# Patient Record
Sex: Male | Born: 1995 | Race: Black or African American | Hispanic: No | Marital: Single | Smoking: Never smoker
Health system: Southern US, Community
[De-identification: ages and names within clinical notes are randomized; demographics above are authoritative.]

---
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
## 2013-11-05 ENCOUNTER — Encounter (HOSPITAL_COMMUNITY): Payer: Self-pay | Admitting: Emergency Medicine

## 2013-11-05 ENCOUNTER — Emergency Department (HOSPITAL_COMMUNITY): Payer: Medicaid Other

## 2013-11-05 ENCOUNTER — Emergency Department (HOSPITAL_COMMUNITY)
Admission: EM | Admit: 2013-11-05 | Discharge: 2013-11-05 | Disposition: A | Discharge status: Home / Self Care | Payer: Medicaid Other | Attending: Emergency Medicine | Admitting: Emergency Medicine

## 2013-11-05 DIAGNOSIS — J029 Acute pharyngitis, unspecified: Secondary | ICD-10-CM | POA: Insufficient documentation

## 2013-11-05 DIAGNOSIS — J111 Influenza due to unidentified influenza virus with other respiratory manifestations: Secondary | ICD-10-CM

## 2013-11-05 DIAGNOSIS — R5381 Other malaise: Secondary | ICD-10-CM | POA: Insufficient documentation

## 2013-11-05 DIAGNOSIS — J3489 Other specified disorders of nose and nasal sinuses: Secondary | ICD-10-CM | POA: Insufficient documentation

## 2013-11-05 DIAGNOSIS — R05 Cough: Secondary | ICD-10-CM | POA: Insufficient documentation

## 2013-11-05 DIAGNOSIS — B9789 Other viral agents as the cause of diseases classified elsewhere: Secondary | ICD-10-CM | POA: Insufficient documentation

## 2013-11-05 DIAGNOSIS — R059 Cough, unspecified: Secondary | ICD-10-CM | POA: Insufficient documentation

## 2013-11-05 DIAGNOSIS — IMO0001 Reserved for inherently not codable concepts without codable children: Secondary | ICD-10-CM | POA: Insufficient documentation

## 2013-11-05 DIAGNOSIS — R5383 Other fatigue: Secondary | ICD-10-CM

## 2013-11-05 MED ORDER — DM-GUAIFENESIN ER 30-600 MG PO TB12: 1.0000 | ORAL_TABLET | Freq: Two times a day (BID) | ORAL | Status: DC

## 2013-11-05 MED ORDER — OSELTAMIVIR PHOSPHATE 75 MG PO CAPS: 75.0000 mg | ORAL_CAPSULE | Freq: Two times a day (BID) | ORAL | Status: DC

## 2013-11-05 NOTE — Discharge Instructions (Signed)
Chest x-ray was negative. Your symptoms are consistent with influenza. Take Mucinex DM as needed for cough and congestion. Take the Motrin or Naprosyn as needed for the bodyaches and sore throat. Take Tamiflu as directed. Return for any newer worse symptoms.

## 2013-11-05 NOTE — ED Provider Notes (Signed)
CSN: [id number]     Arrival date & time 11/05/13  0522 History   First MD Initiated Contact with Patient 11/05/13 [phone]     Chief Complaint  Patient presents with  . Fever  . Chills   (Consider location/radiation/quality/duration/timing/severity/associated sxs/prior Treatment) Patient is a 18 y.o. male presenting with fever. The history is provided by the patient.  Fever Associated symptoms: chills, congestion, cough, myalgias and sore throat   Associated symptoms: no confusion, no dysuria, no headaches, no nausea, no rash and no vomiting   patient with onset of flulike symptoms starting yesterday chills bodyaches fever mild sore throat cough and congestion. Patient without past history of strep throat. Patient's been taking NyQuil at home.   History reviewed. No pertinent past medical history. History reviewed. No pertinent past surgical history. History reviewed. No pertinent family history. History  Substance Use Topics  . Smoking status: Never Smoker   . Smokeless tobacco: Not on file  . Alcohol Use: No    Review of Systems  Constitutional: Positive for fever, chills and fatigue.  HENT: Positive for congestion and sore throat.   Eyes: Negative for redness.  Respiratory: Positive for cough. Negative for shortness of breath.   Gastrointestinal: Negative for nausea, vomiting and abdominal pain.  Genitourinary: Negative for dysuria.  Musculoskeletal: Positive for myalgias.  Skin: Negative for rash.  Neurological: Negative for headaches.  Hematological: Does not bruise/bleed easily.  Psychiatric/Behavioral: Negative for confusion.    Allergies  Review of patient's allergies indicates no known allergies.  Home Medications   Current Outpatient Rx  Name  Route  Sig  Dispense  Refill  . DM-Doxylamine-Acetaminophen (VICKS NYQUIL COLD & FLU) 15-6.25-325 MG/15ML LIQD   Oral   Take 15 mLs by mouth at bedtime as needed (cold symptoms).         Marland Kitchen dextromethorphan-guaiFENesin  (MUCINEX DM) 30-600 MG per 12 hr tablet   Oral   Take 1 tablet by mouth 2 (two) times daily.   14 tablet   1   . oseltamivir (TAMIFLU) 75 MG capsule   Oral   Take 1 capsule (75 mg total) by mouth every 12 (twelve) hours.   10 capsule   0    BP 156/73  Pulse 78  Temp(Src) 97.8 F (36.6 C) (Oral)  Resp 18  Ht 6' (1.829 m)  Wt 165 lb (74.844 kg)  BMI 22.37 kg/m2  SpO2 98% Physical Exam  Nursing note and vitals reviewed. Constitutional: He is oriented to person, place, and time. He appears well-developed and well-nourished. No distress.  HENT:  Head: Normocephalic and atraumatic.  Mouth/Throat: Oropharynx is clear and moist. No oropharyngeal exudate.  Mild erythema  Eyes: Conjunctivae and EOM are normal. Pupils are equal, round, and reactive to light.  Neck: Normal range of motion. Neck supple.  Cardiovascular: Normal rate, regular rhythm and normal heart sounds.   No murmur heard. Pulmonary/Chest: Effort normal and breath sounds normal. No respiratory distress.  Abdominal: Soft. Bowel sounds are normal. There is no tenderness.  Musculoskeletal: Normal range of motion.  Neurological: He is alert and oriented to person, place, and time. No cranial nerve deficit. He exhibits normal muscle tone. Coordination normal.  Skin: Skin is warm. No rash noted.    ED Course  Procedures (including critical care time) Labs Review Labs Reviewed - No data to display Imaging Review Dg Chest 2 View  11/05/2013   CLINICAL DATA:  Fever, chills, cough and congestion for 2-3 days.  EXAM: CHEST  2 VIEW  COMPARISON:  None.  FINDINGS: The heart size and mediastinal contours are within normal limits. Both lungs are clear. The visualized skeletal structures are unremarkable.  IMPRESSION: No active cardiopulmonary disease.   Electronically Signed   By: Elberta Fortisaniel  Boyle M.D.   On: 11/05/2013 07:42    EKG Interpretation   None       MDM   1. Influenza     Symptoms most likely consistent with  developing influenza. Patient with chills bodyaches some mild sore throat and fever and congestion starting yesterday. Chest x-ray is negative for pneumonia. Patient nontoxic here no tachycardia. Will treat symptomatically.    Shelda JakesScott W. Sharissa Brierley, MD 11/05/13 [phone]

## 2013-11-05 NOTE — ED Notes (Signed)
Pt complains of chills, sore throat and a fever since yesterday

## 2014-03-28 IMAGING — CR DG CHEST 2V
2 series · 2 of 2 positions shown · non-contrast
Comparison: None.

CLINICAL DATA: Fever, chills, cough and congestion for 2-3 days.

EXAM:
CHEST  2 VIEW

[w chest pa]
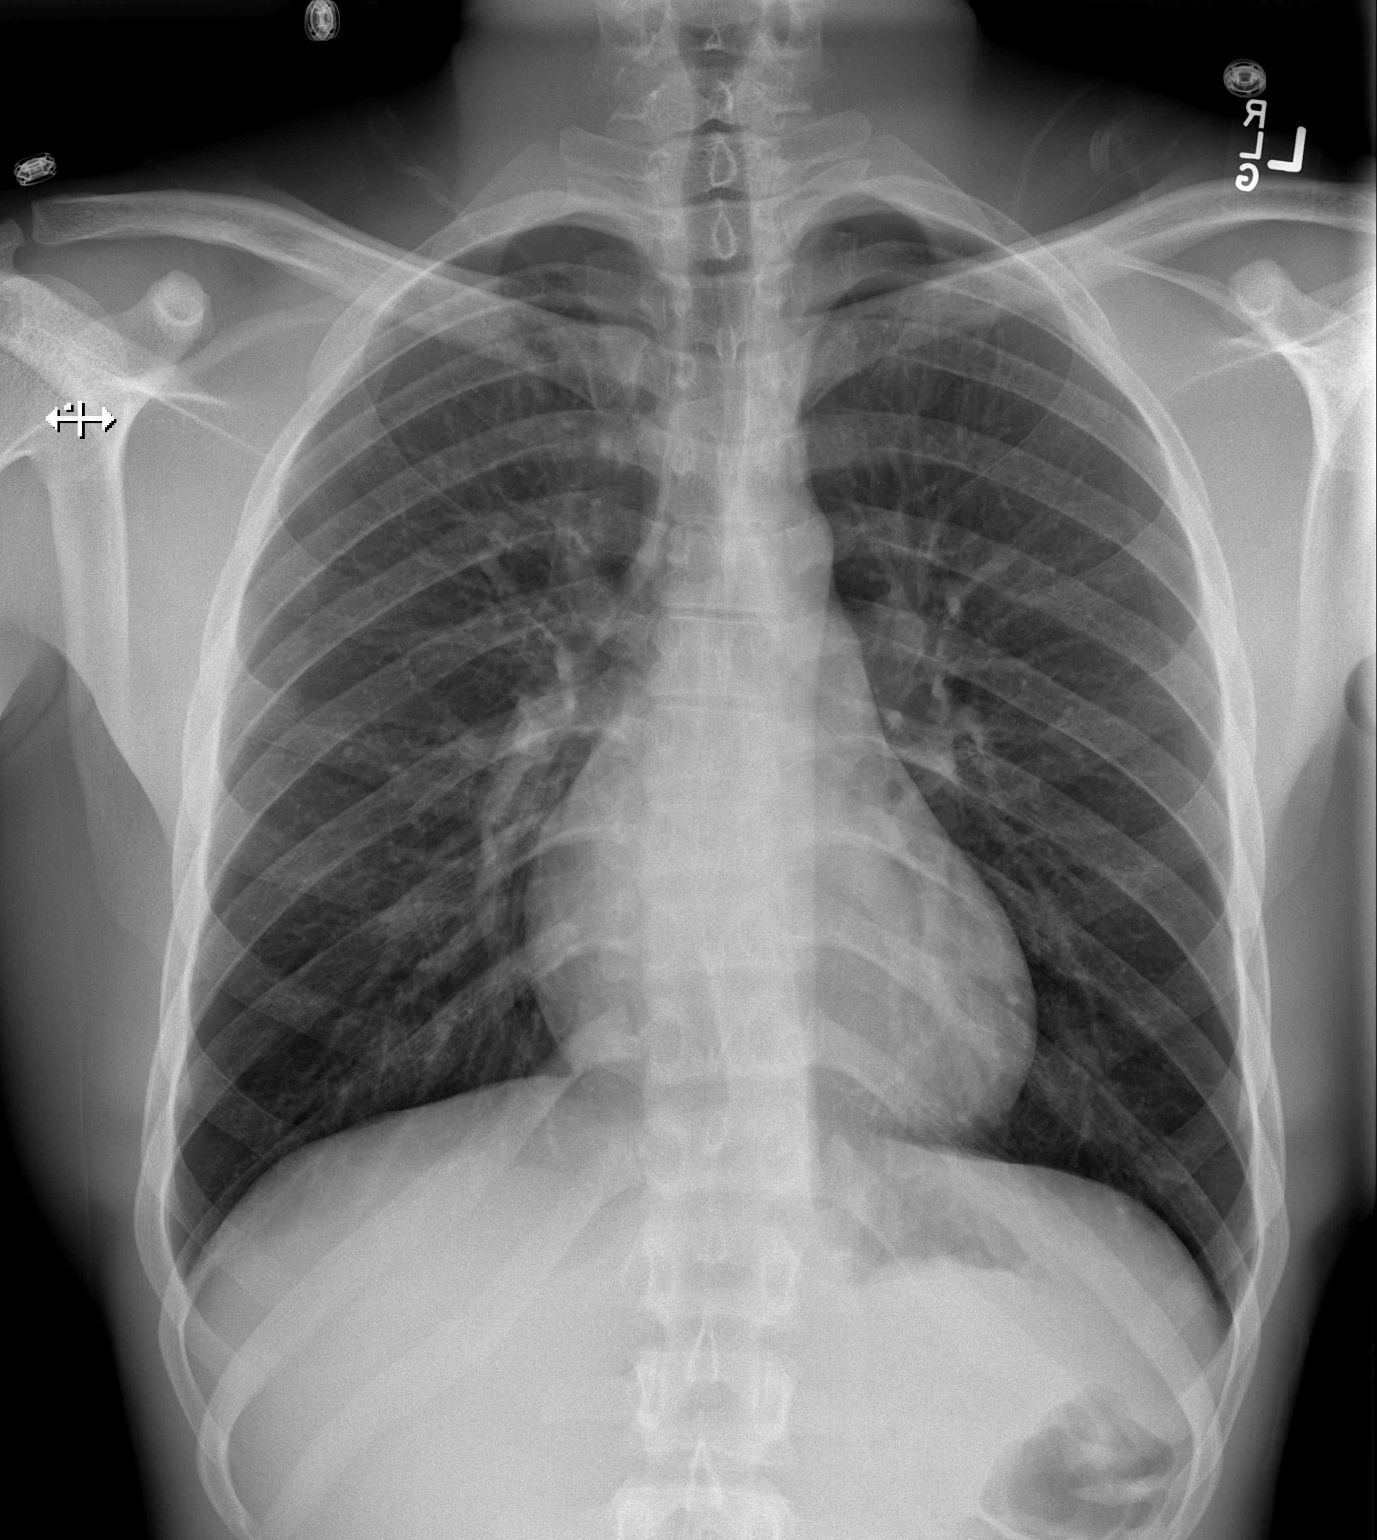

[w chest lat]
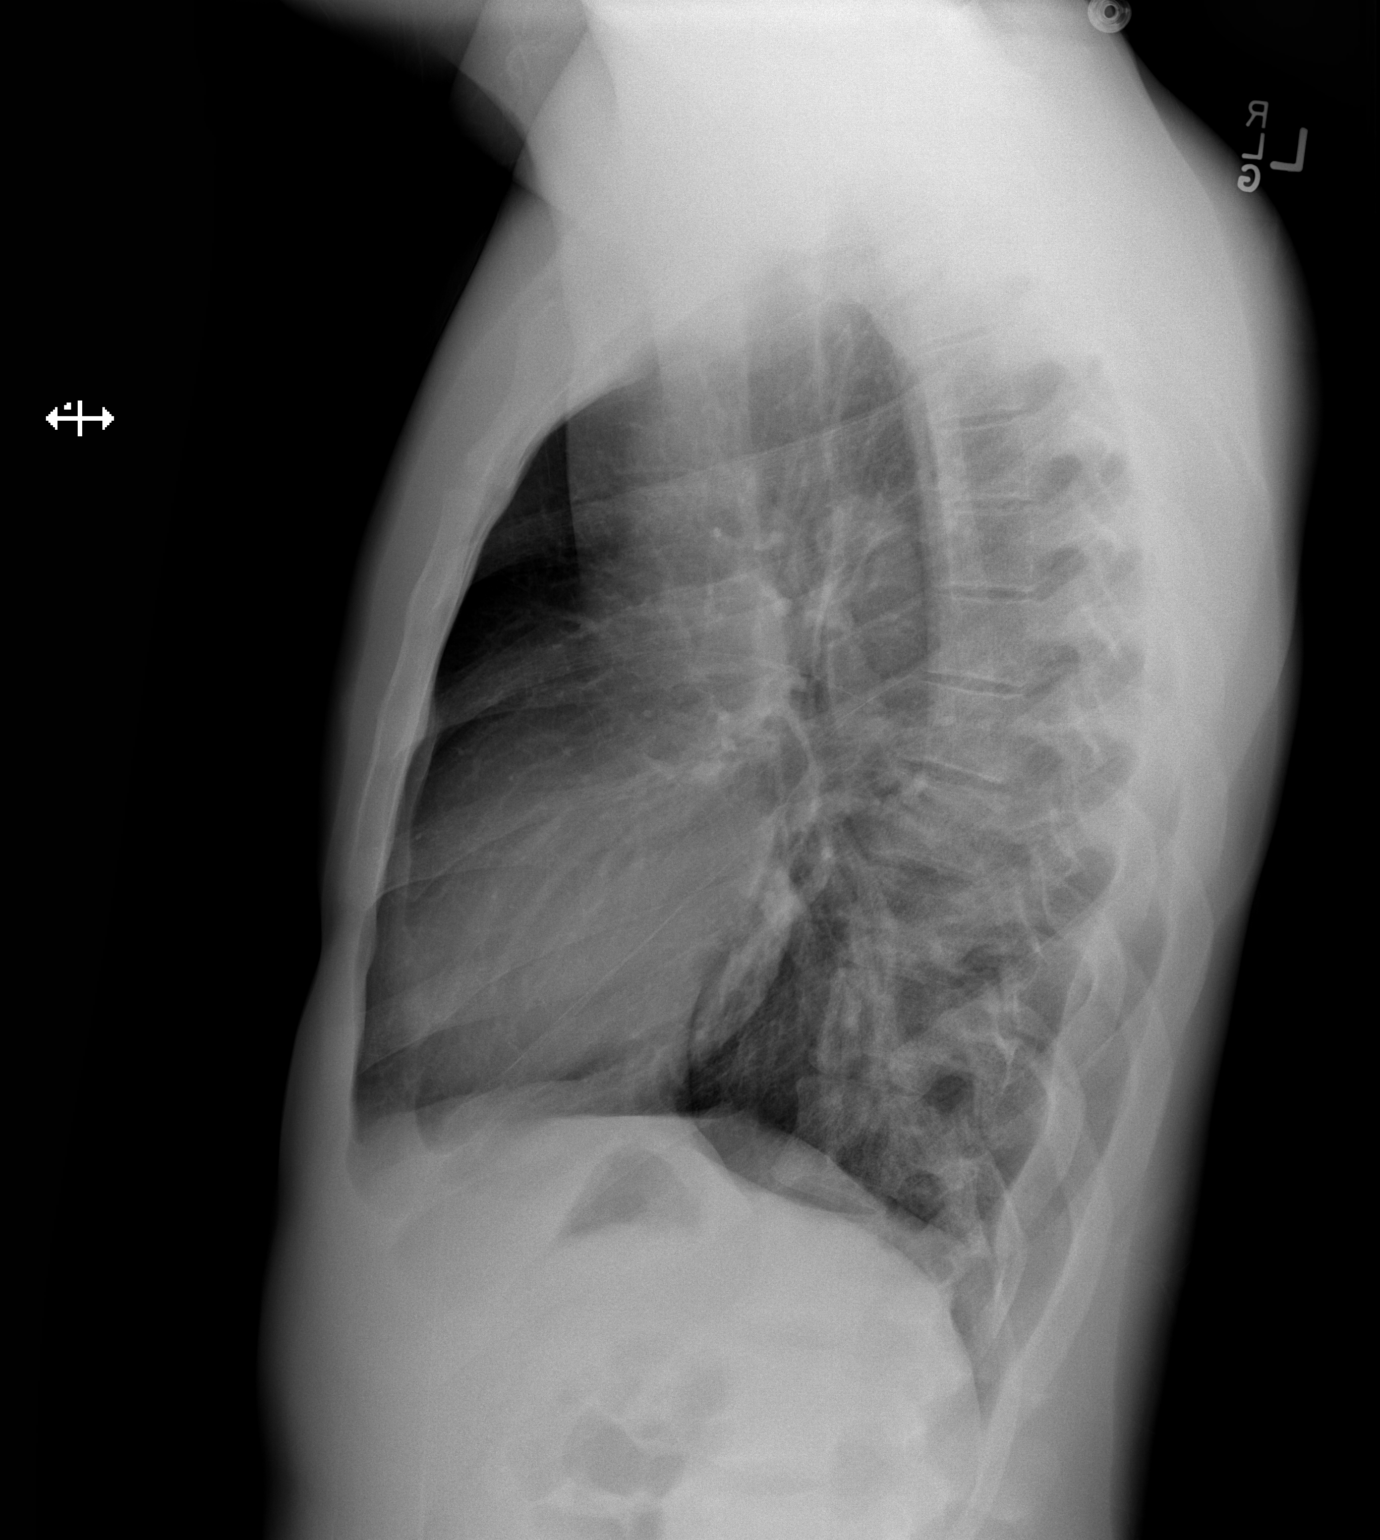

[2 of 2 positions shown; findings below may reference images not displayed]

FINDINGS: The heart size and mediastinal contours are within normal limits.
Both lungs are clear. The visualized skeletal structures are
unremarkable.
IMPRESSION: No active cardiopulmonary disease.

## 2014-11-13 ENCOUNTER — Encounter (HOSPITAL_COMMUNITY): Payer: Self-pay | Admitting: Emergency Medicine

## 2014-11-13 ENCOUNTER — Emergency Department (INDEPENDENT_AMBULATORY_CARE_PROVIDER_SITE_OTHER)
Admission: EM | Admit: 2014-11-13 | Discharge: 2014-11-13 | Disposition: A | Discharge status: Home / Self Care | Payer: Medicaid Other | Source: Home / Self Care | Attending: Emergency Medicine | Admitting: Emergency Medicine

## 2014-11-13 DIAGNOSIS — N451 Epididymitis: Secondary | ICD-10-CM

## 2014-11-13 DIAGNOSIS — N342 Other urethritis: Secondary | ICD-10-CM

## 2014-11-13 MED ORDER — DOXYCYCLINE HYCLATE 100 MG PO TABS: 100.0000 mg | ORAL_TABLET | Freq: Two times a day (BID) | ORAL | Status: DC

## 2014-11-13 MED ORDER — CEFTRIAXONE SODIUM 250 MG IJ SOLR
250.0000 mg | Freq: Once | INTRAMUSCULAR | Status: AC
  Administered 2014-11-13: 250 mg via INTRAMUSCULAR

## 2014-11-13 MED ORDER — CEFTRIAXONE SODIUM 250 MG IJ SOLR
INTRAMUSCULAR | Status: AC
  Filled 2014-11-13: qty 250

## 2014-11-13 MED ORDER — LIDOCAINE HCL (PF) 1 % IJ SOLN
INTRAMUSCULAR | Status: AC
  Filled 2014-11-13: qty 5

## 2014-11-13 MED ORDER — AZITHROMYCIN 250 MG PO TABS
1000.0000 mg | ORAL_TABLET | Freq: Once | ORAL | Status: AC
  Administered 2014-11-13: 1000 mg via ORAL

## 2014-11-13 MED ORDER — AZITHROMYCIN 250 MG PO TABS
ORAL_TABLET | ORAL | Status: AC
  Filled 2014-11-13: qty 4

## 2014-11-13 NOTE — ED Notes (Signed)
Pt states that he believes he was exposed to a STD. Pt states that he has discharge and penile and groin pain.

## 2014-11-13 NOTE — Discharge Instructions (Signed)
Epididymitis Epididymitis is a swelling (inflammation) of the epididymis. The epididymis is a cord-like structure along the back part of the testicle. Epididymitis is usually, but not always, caused by infection. This is usually a sudden problem beginning with chills, fever and pain behind the scrotum and in the testicle. There may be swelling and redness of the testicle. DIAGNOSIS  Physical examination will reveal a tender, swollen epididymis. Sometimes, cultures are obtained from the urine or from prostate secretions to help find out if there is an infection or if the cause is a different problem. Sometimes, blood work is performed to see if your white blood cell count is elevated and if a germ (bacterial) or viral infection is present. Using this knowledge, an appropriate medicine which kills germs (antibiotic) can be chosen by your caregiver. A viral infection causing epididymitis will most often go away (resolve) without treatment. HOME CARE INSTRUCTIONS   Hot sitz baths for 20 minutes, 4 times per day, may help relieve pain.  Only take over-the-counter or prescription medicines for pain, discomfort or fever as directed by your caregiver.  Take all medicines, including antibiotics, as directed. Take the antibiotics for the full prescribed length of time even if you are feeling better.  It is very important to keep all follow-up appointments. SEEK IMMEDIATE MEDICAL CARE IF:   You have a fever.  You have pain not relieved with medicines.  You have any worsening of your problems.  Your pain seems to come and go.  You develop pain, redness, and swelling in the scrotum and surrounding areas. MAKE SURE YOU:   Understand these instructions.  Will watch your condition.  Will get help right away if you are not doing well or get worse. Document Released: 10/19/2000 Document Revised: 01/14/2012 Document Reviewed: 09/08/2009 [facility] Patient Information 2015 [place], [place]. This information  is not intended to replace advice given to you by your health care provider. Make sure you discuss any questions you have with your health care provider.  Urethritis Urethritis is an inflammation of the tube through which urine exits your bladder (urethra).  CAUSES Urethritis is often caused by an infection in your urethra. The infection can be viral, like herpes. The infection can also be bacterial, like gonorrhea. RISK FACTORS Risk factors of urethritis include:  Having sex without using a condom.  Having multiple sexual partners.  Having poor hygiene. SIGNS AND SYMPTOMS Symptoms of urethritis are less noticeable in women than in men. These symptoms include:  Burning feeling when you urinate (dysuria).  Discharge from your urethra.  Blood in your urine (hematuria).  Urinating more than usual. DIAGNOSIS  To confirm a diagnosis of urethritis, your health care provider will do the following:  Ask about your sexual history.  Perform a physical exam.  Have you provide a sample of your urine for lab testing.  Use a cotton swab to gently collect a sample from your urethra for lab testing. TREATMENT  It is important to treat urethritis. Depending on the cause, untreated urethritis may lead to serious genital infections and possibly infertility. Urethritis caused by a bacterial infection is treated with antibiotic medicine. All sexual partners must be treated.  HOME CARE INSTRUCTIONS  Do not have sex until the test results are known and treatment is completed, even if your symptoms go away before you finish treatment.  If you were prescribed an antibiotic, finish it all even if you start to feel better. SEEK MEDICAL CARE IF:   Your symptoms are not improved  in 3 days.  Your symptoms are getting worse.  You develop abdominal pain or pelvic pain (in women).  You develop joint pain.  You have a fever. SEEK IMMEDIATE MEDICAL CARE IF:   You have severe pain in the belly,  back, or side.  You have repeated vomiting. MAKE SURE YOU:  Understand these instructions.  Will watch your condition.  Will get help right away if you are not doing well or get worse. Document Released: 04/17/2001 Document Revised: 03/08/2014 Document Reviewed: 06/22/2013 [facility] Patient Information 2015 [place], [place]. This information is not intended to replace advice given to you by your health care provider. Make sure you discuss any questions you have with your health care provider.

## 2014-11-13 NOTE — ED Provider Notes (Signed)
   Chief Complaint   Exposure to STD   History of Present Illness   Jack Stone is a 19 year old male who has had a three-day history of urethral discharge, burning with urination, penile pain, and testicular pain and swelling. He denies any fever, chills, nausea, or vomiting. He's had no lesions on the penis. He has had unprotected intercourse before the symptoms began. He was tested for gon[facility] and [facility]lam[facility], but he does not know the results yet, and he was not treated.  Review of Systems   Other than as noted above, the patient denies any of the following symptoms: Systemic:  No fevers chills, arthralgias, or adenopathy. GI:  No abdominal pain, nausea or vomiting. GU:  No dysuria, penile pain, discharge, itching, dysuria, genital lesions, testicular pain or swelling. Skin:  No rash or itching.  PMFSH   Past medical history, family history, social history, meds, and allergies were reviewed.   Physical Examination    Vital signs:  BP 174/79 mmHg  Pulse 62  Temp(Src) 98 F (36.7 C) (Oral)  Resp 14  SpO2 100% Gen:  Alert, oriented, in no distress. ENT:  Pharynx clear, no oral lesions.  Abdomen:  Soft and flat, non-distended, and non-tender.  No organomegaly or mass. Genital:  There is a scant, white urethral discharge. No lesions on the penis. Testicles and epididymis are tender bilaterally but no swelling or mass. No hernia. He has shotty, bilateral inguinal adenopathy. Skin:  Warm and dry.  No rash.   Labs   Serologies for HIV and syphilis were obtained.  Course in Urgent Care Center   The following medications were given:  Medications  cefTRIAXone (ROCEPHIN) injection 250 mg   azithromycin (ZITHROMAX) tablet 1,000 mg    Assessment   The primary encounter diagnosis was Urethritis. A diagnosis of Epididymitis was also pertinent to this visit.  Probable chlamydial infection.  Plan    1.  Meds:  The  following meds were prescribed:   New Prescriptions   DOXYCYCLINE (VIBRA-TABS) 100 MG TABLET    Take 1 tablet (100 mg total) by mouth 2 (two) times daily.    2.  Patient Education/Counseling:  The patient was given appropriate handouts, self care instructions, and instructed in symptomatic relief.The patient was instructed to inform all sexual contacts, avoid intercourse completely for 2 weeks and then only with a condom.  The patient was told that we would call about all abnormal lab results, and that we would need to report certain kinds of infection to the health department.    3.  Follow up:  The patient was told to follow up here if no better in 3 to 4 days, or sooner if becoming worse in any way, and given some red flag symptoms such as fever, pain, or difficulty urinating which would prompt immediate return.       Reuben Likesavid C Joakim Huesman, MD 11/13/14 [phone]

## 2014-11-15 LAB — HIV ANTIBODY (ROUTINE TESTING W REFLEX)
HIV 1/O/2 Abs-Index Value: <1 (ref <1.00)
HIV-1/HIV-2 Ab: NONREACTIVE

## 2014-11-15 LAB — RPR: RPR Ser Ql: NONREACTIVE

## 2015-01-16 ENCOUNTER — Emergency Department (HOSPITAL_COMMUNITY)
Admission: EM | Admit: 2015-01-16 | Discharge: 2015-01-16 | Disposition: A | Discharge status: Home / Self Care | Payer: Self-pay | Source: Home / Self Care | Attending: Emergency Medicine | Admitting: Emergency Medicine

## 2015-01-16 ENCOUNTER — Other Ambulatory Visit (HOSPITAL_COMMUNITY)
Admission: RE | Admit: 2015-01-16 | Discharge: 2015-01-16 | Disposition: A | Discharge status: Home / Self Care | Payer: Medicaid Other | Source: Ambulatory Visit | Attending: Emergency Medicine | Admitting: Emergency Medicine

## 2015-01-16 ENCOUNTER — Encounter (HOSPITAL_COMMUNITY): Payer: Self-pay | Admitting: Emergency Medicine

## 2015-01-16 DIAGNOSIS — A64 Unspecified sexually transmitted disease: Secondary | ICD-10-CM

## 2015-01-16 DIAGNOSIS — Z113 Encounter for screening for infections with a predominantly sexual mode of transmission: Secondary | ICD-10-CM | POA: Insufficient documentation

## 2015-01-16 LAB — POCT URINALYSIS DIP (DEVICE)
Glucose, UA: NEGATIVE mg/dL
Hgb urine dipstick: NEGATIVE
Ketones, ur: NEGATIVE mg/dL
LEUKOCYTES UA: NEGATIVE
NITRITE: NEGATIVE
Protein, ur: NEGATIVE mg/dL
Specific Gravity, Urine: >=1.03 (ref 1.005–1.030)
UROBILINOGEN UA: 0.2 mg/dL (ref 0.0–1.0)
pH: 5.5 (ref 5.0–8.0)

## 2015-01-16 MED ORDER — CEFTRIAXONE SODIUM 250 MG IJ SOLR
INTRAMUSCULAR | Status: AC
  Filled 2015-01-16: qty 250

## 2015-01-16 MED ORDER — CEFTRIAXONE SODIUM 250 MG IJ SOLR
250.0000 mg | Freq: Once | INTRAMUSCULAR | Status: AC
  Administered 2015-01-16: 250 mg via INTRAMUSCULAR

## 2015-01-16 MED ORDER — AZITHROMYCIN 250 MG PO TABS
ORAL_TABLET | ORAL | Status: AC
  Filled 2015-01-16: qty 4

## 2015-01-16 MED ORDER — AZITHROMYCIN 250 MG PO TABS
1000.0000 mg | ORAL_TABLET | Freq: Once | ORAL | Status: AC
  Administered 2015-01-16: 1000 mg via ORAL

## 2015-01-16 NOTE — Discharge Instructions (Signed)
We treated you for gonorrhea and Chlamydia today. We sent blood work and urine for additional testing. We will call you if anything comes back positive. No sex for 1 week. Follow-up if your symptoms do not resolve in the next week.

## 2015-01-16 NOTE — ED Notes (Signed)
C/o  Groin pain.  Penile discharge.    Denies fever, n/v/d.   No pelvic or abdominal pain.  Symptoms present x 3 days.

## 2015-01-16 NOTE — ED Provider Notes (Signed)
CSN: [id number]     Arrival date & time 01/16/15  1237 History   First MD Initiated Contact with Patient 01/16/15 1255     Chief Complaint  Patient presents with  . Groin Pain  . Penile Discharge   (Consider location/radiation/quality/duration/timing/severity/associated sxs/prior Treatment) HPI He is a 19 year old man here for evaluation of groin pain. He states for the last days he has had some swelling and pain in his left groin. This has spread to involve his penis and testicle. He had some penile discharge yesterday. He does report a new sexual partner in the last week where the condom fell off. No fevers, chills, abdominal pain, pelvic pain.  History reviewed. No pertinent past medical history. History reviewed. No pertinent past surgical history. History reviewed. No pertinent family history. History  Substance Use Topics  . Smoking status: Never Smoker   . Smokeless tobacco: Not on file  . Alcohol Use: No    Review of Systems  Constitutional: Negative for fever.  Gastrointestinal: Negative for abdominal pain.  Genitourinary: Positive for discharge, penile pain and testicular pain. Negative for dysuria.    Allergies  Review of patient's allergies indicates no known allergies.  Home Medications   Prior to Admission medications   Medication Sig Start Date End Date Taking? Authorizing Provider  dextromethorphan-guaiFENesin (MUCINEX DM) 30-600 MG per 12 hr tablet Take 1 tablet by mouth 2 (two) times daily. 11/05/13   Vanetta MuldersScott Zackowski, MD  DM-Doxylamine-Acetaminophen (VICKS NYQUIL COLD & FLU) 15-6.25-325 MG/15ML LIQD Take 15 mLs by mouth at bedtime as needed (cold symptoms).    Historical Provider, MD  doxycycline (VIBRA-TABS) 100 MG tablet Take 1 tablet (100 mg total) by mouth 2 (two) times daily. 11/13/14   Reuben Likesavid C Keller, MD  oseltamivir (TAMIFLU) 75 MG capsule Take 1 capsule (75 mg total) by mouth every 12 (twelve) hours. 11/05/13   Vanetta MuldersScott Zackowski, MD   BP 146/79 mmHg  Pulse  57  Temp(Src) 98 F (36.7 C) (Oral)  Resp 16  SpO2 100% Physical Exam  Constitutional: He is oriented to person, place, and time. He appears well-developed and well-nourished. No distress.  Cardiovascular: Normal rate.   Pulmonary/Chest: Effort normal.  Genitourinary: Testes normal and penis normal. Right testis shows no swelling and no tenderness. Left testis shows no swelling and no tenderness. Circumcised. No discharge found.  Lymphadenopathy:       Right: No inguinal adenopathy present.       Left: Inguinal adenopathy present.  Neurological: He is alert and oriented to person, place, and time.    ED Course  Procedures (including critical care time) Labs Review Labs Reviewed  POCT URINALYSIS DIP (DEVICE) - Abnormal; Notable for the following:    Bilirubin Urine SMALL (*)    All other components within normal limits  HIV ANTIBODY (ROUTINE TESTING)  RPR  URINE CYTOLOGY ANCILLARY ONLY    Imaging Review No results found.   MDM   1. STD (male)    Will treat presumptively with Rocephin 250 mg IM and azithromycin 1 g by mouth. Blood and urine drawn for STD screening. Follow-up as needed.    Charm RingsErin J Honig, MD 01/16/15 1344

## 2015-01-17 LAB — HIV ANTIBODY (ROUTINE TESTING W REFLEX): HIV Screen 4th Generation wRfx: NONREACTIVE

## 2015-01-17 LAB — RPR: RPR: NONREACTIVE

## 2015-01-18 LAB — URINE CYTOLOGY ANCILLARY ONLY
Chlamydia: NEGATIVE
Neisseria Gonorrhea: NEGATIVE
Trichomonas: NEGATIVE

## 2015-01-25 ENCOUNTER — Other Ambulatory Visit (HOSPITAL_COMMUNITY)
Admission: RE | Admit: 2015-01-25 | Discharge: 2015-01-25 | Disposition: A | Discharge status: Home / Self Care | Payer: Medicaid Other | Source: Ambulatory Visit | Attending: Family Medicine | Admitting: Family Medicine

## 2015-01-25 ENCOUNTER — Emergency Department (HOSPITAL_COMMUNITY)
Admission: EM | Admit: 2015-01-25 | Discharge: 2015-01-25 | Disposition: A | Discharge status: Home / Self Care | Payer: Self-pay | Source: Home / Self Care | Attending: Family Medicine | Admitting: Family Medicine

## 2015-01-25 ENCOUNTER — Encounter (HOSPITAL_COMMUNITY): Payer: Self-pay | Admitting: Emergency Medicine

## 2015-01-25 DIAGNOSIS — N342 Other urethritis: Secondary | ICD-10-CM

## 2015-01-25 DIAGNOSIS — Z113 Encounter for screening for infections with a predominantly sexual mode of transmission: Secondary | ICD-10-CM | POA: Insufficient documentation

## 2015-01-25 MED ORDER — AZITHROMYCIN 250 MG PO TABS
1000.0000 mg | ORAL_TABLET | Freq: Once | ORAL | Status: AC
  Administered 2015-01-25: 1000 mg via ORAL

## 2015-01-25 MED ORDER — CEFTRIAXONE SODIUM 250 MG IJ SOLR
250.0000 mg | Freq: Once | INTRAMUSCULAR | Status: AC
  Administered 2015-01-25: 250 mg via INTRAMUSCULAR

## 2015-01-25 MED ORDER — AZITHROMYCIN 250 MG PO TABS
ORAL_TABLET | ORAL | Status: AC
  Filled 2015-01-25: qty 4

## 2015-01-25 MED ORDER — LIDOCAINE HCL (PF) 1 % IJ SOLN
INTRAMUSCULAR | Status: AC
  Filled 2015-01-25: qty 5

## 2015-01-25 MED ORDER — CEFTRIAXONE SODIUM 250 MG IJ SOLR
INTRAMUSCULAR | Status: AC
  Filled 2015-01-25: qty 250

## 2015-01-25 NOTE — ED Provider Notes (Signed)
Jack SchilderJavon C Stone is a 19 y.o. male who presents to Urgent Care today for penile pain and discharge associated with left testicle pain. Symptoms present for the last 2-3 days. Symptoms are consistent with previous episodes of Chlamydia. Patient has not tried any treatment yet. No fevers chills nausea vomiting or diarrhea.   History reviewed. No pertinent past medical history. History reviewed. No pertinent past surgical history. History  Substance Use Topics  . Smoking status: Never Smoker   . Smokeless tobacco: Not on file  . Alcohol Use: No   ROS as above Medications: No current facility-administered medications for this encounter.   Current Outpatient Prescriptions  Medication Sig Dispense Refill  . dextromethorphan-guaiFENesin (MUCINEX DM) 30-600 MG per 12 hr tablet Take 1 tablet by mouth 2 (two) times daily. 14 tablet 1  . DM-Doxylamine-Acetaminophen (VICKS NYQUIL COLD & FLU) 15-6.25-325 MG/15ML LIQD Take 15 mLs by mouth at bedtime as needed (cold symptoms).     No Known Allergies   Exam:  BP 133/70 mmHg  Pulse 69  Temp(Src) 97.9 F (36.6 C) (Oral)  Resp 16  SpO2 98%  Gen: Well NAD Genitals: No inguinal lymphadenopathy. Testicles are descended bilaterally. The right is nontender with no masses. The left is tender in the epididymis area with no masses. Penis is normal appearing without lesions. Penis is circumcised. Small amount of discharge present.  Patient was given 1 g oral azithromycin, and 250 mg IM ceftriaxone prior to discharge.  No results found for this or any previous visit (from the past 24 hour(s)). No results found.  Assessment and Plan: 19 y.o. male with urethritis and epididymitis. Treat with ceftriaxone and azithromycin. Urine cytology for gonorrhea Chlamydia Trichomonas as well as serology for HIV and syphilis pending.  Discussed warning signs or symptoms. Please see discharge instructions. Patient expresses understanding.     Rodolph BongEvan S Bresha Hosack,  MD 01/25/15 [phone]

## 2015-01-25 NOTE — ED Notes (Signed)
Patient is aware of post injection delay

## 2015-01-25 NOTE — ED Notes (Signed)
C/o penile discharge, no urinary symptoms, onset 3 days ago

## 2015-01-25 NOTE — Discharge Instructions (Signed)
Thank you for coming in today. ° °Urethritis °Urethritis is an inflammation of the tube through which urine exits your bladder (urethra).  °CAUSES °Urethritis is often caused by an infection in your urethra. The infection can be viral, like herpes. The infection can also be bacterial, like gonorrhea. °RISK FACTORS °Risk factors of urethritis include: °· Having sex without using a condom. °· Having multiple sexual partners. °· Having poor hygiene. °SIGNS AND SYMPTOMS °Symptoms of urethritis are less noticeable in women than in men. These symptoms include: °· Burning feeling when you urinate (dysuria). °· Discharge from your urethra. °· Blood in your urine (hematuria). °· Urinating more than usual. °DIAGNOSIS  °To confirm a diagnosis of urethritis, your health care provider will do the following: °· Ask about your sexual history. °· Perform a physical exam. °· Have you provide a sample of your urine for lab testing. °· Use a cotton swab to gently collect a sample from your urethra for lab testing. °TREATMENT  °It is important to treat urethritis. Depending on the cause, untreated urethritis may lead to serious genital infections and possibly infertility. Urethritis caused by a bacterial infection is treated with antibiotic medicine. All sexual partners must be treated.  °HOME CARE INSTRUCTIONS °· Do not have sex until the test results are known and treatment is completed, even if your symptoms go away before you finish treatment. °· If you were prescribed an antibiotic, finish it all even if you start to feel better. °SEEK MEDICAL CARE IF:  °· Your symptoms are not improved in 3 days. °· Your symptoms are getting worse. °· You develop abdominal pain or pelvic pain (in women). °· You develop joint pain. °· You have a fever. °SEEK IMMEDIATE MEDICAL CARE IF:  °· You have severe pain in the belly, back, or side. °· You have repeated vomiting. °MAKE SURE YOU: °· Understand these instructions. °· Will watch your  condition. °· Will get help right away if you are not doing well or get worse. °Document Released: 04/17/2001 Document Revised: 03/08/2014 Document Reviewed: 06/22/2013 °ExitCare® Patient Information ©2015 [employer]. This information is not intended to replace advice given to you by your health care provider. Make sure you discuss any questions you have with your health care provider. ° °

## 2015-01-26 LAB — RPR: RPR Ser Ql: NONREACTIVE

## 2015-01-26 LAB — URINE CYTOLOGY ANCILLARY ONLY
Chlamydia: NEGATIVE
Neisseria Gonorrhea: NEGATIVE
Trichomonas: NEGATIVE

## 2015-01-26 LAB — HIV ANTIBODY (ROUTINE TESTING W REFLEX): HIV SCREEN 4TH GENERATION: NONREACTIVE

## 2015-03-08 ENCOUNTER — Encounter (HOSPITAL_COMMUNITY): Payer: Self-pay | Admitting: *Deleted

## 2015-03-08 ENCOUNTER — Emergency Department (INDEPENDENT_AMBULATORY_CARE_PROVIDER_SITE_OTHER)
Admission: EM | Admit: 2015-03-08 | Discharge: 2015-03-08 | Disposition: A | Discharge status: Home / Self Care | Payer: Self-pay | Source: Home / Self Care | Attending: Family Medicine | Admitting: Family Medicine

## 2015-03-08 ENCOUNTER — Other Ambulatory Visit (HOSPITAL_COMMUNITY)
Admission: RE | Admit: 2015-03-08 | Discharge: 2015-03-08 | Disposition: A | Discharge status: Home / Self Care | Payer: Self-pay | Source: Ambulatory Visit | Attending: Family Medicine | Admitting: Family Medicine

## 2015-03-08 DIAGNOSIS — Z113 Encounter for screening for infections with a predominantly sexual mode of transmission: Secondary | ICD-10-CM | POA: Insufficient documentation

## 2015-03-08 DIAGNOSIS — Z711 Person with feared health complaint in whom no diagnosis is made: Secondary | ICD-10-CM

## 2015-03-08 MED ORDER — CEFTRIAXONE SODIUM 250 MG IJ SOLR
250.0000 mg | Freq: Once | INTRAMUSCULAR | Status: AC
  Administered 2015-03-08: 250 mg via INTRAMUSCULAR

## 2015-03-08 MED ORDER — CEFTRIAXONE SODIUM 250 MG IJ SOLR
INTRAMUSCULAR | Status: AC
  Filled 2015-03-08: qty 250

## 2015-03-08 MED ORDER — AZITHROMYCIN 250 MG PO TABS
1000.0000 mg | ORAL_TABLET | Freq: Once | ORAL | Status: AC
  Administered 2015-03-08: 1000 mg via ORAL

## 2015-03-08 MED ORDER — AZITHROMYCIN 250 MG PO TABS
ORAL_TABLET | ORAL | Status: AC
  Filled 2015-03-08: qty 4

## 2015-03-08 MED ORDER — LIDOCAINE HCL (PF) 1 % IJ SOLN
INTRAMUSCULAR | Status: AC
  Filled 2015-03-08: qty 5

## 2015-03-08 NOTE — ED Notes (Signed)
Instructions  Given  Verbally  To  Pt        Pt  Was  Advised  To  Abstain  From  Sexual   Relations  For  At  Least one  Week  And  To use  Protection  And  We  Would notify  Him of any  Positive  Results     Pt  Left  Without  Signing  Discharge  Papers

## 2015-03-08 NOTE — ED Notes (Signed)
Pt  States  Pain in his  Testicles             He  Reports  A  Tingling    And    States  It  Felt  Like  It  Did  The  Last  Time  He  Had   chylmydia             He  Walked  Upright  Into  The  Exam room  With a  Steady  Fluid  Gait

## 2015-03-08 NOTE — Discharge Instructions (Signed)
We will call with positive test results and treat as indicated  °

## 2015-03-08 NOTE — ED Provider Notes (Signed)
CSN: [id number]     Arrival date & time 03/08/15  1358 History   First MD Initiated Contact with Patient 03/08/15 1519     Chief Complaint  Patient presents with  . Exposure to STD   (Consider location/radiation/quality/duration/timing/severity/associated sxs/prior Treatment) Patient is a 19 y.o. male presenting with STD exposure. The history is provided by the patient.  Exposure to STD This is a new problem. The current episode started more than 2 days ago. The problem has not changed since onset.Pertinent negatives include no abdominal pain.    History reviewed. No pertinent past medical history. History reviewed. No pertinent past surgical history. History reviewed. No pertinent family history. History  Substance Use Topics  . Smoking status: Never Smoker   . Smokeless tobacco: Not on file  . Alcohol Use: No    Review of Systems  Constitutional: Negative.   Gastrointestinal: Negative.  Negative for abdominal pain.  Genitourinary: Positive for dysuria. Negative for discharge, penile swelling, scrotal swelling, penile pain and testicular pain.    Allergies  Review of patient's allergies indicates no known allergies.  Home Medications   Prior to Admission medications   Medication Sig Start Date End Date Taking? Authorizing Provider  dextromethorphan-guaiFENesin (MUCINEX DM) 30-600 MG per 12 hr tablet Take 1 tablet by mouth 2 (two) times daily. 11/05/13   Vanetta MuldersScott Zackowski, MD  DM-Doxylamine-Acetaminophen (VICKS NYQUIL COLD & FLU) 15-6.25-325 MG/15ML LIQD Take 15 mLs by mouth at bedtime as needed (cold symptoms).    Historical Provider, MD   BP 128/84 mmHg  Pulse 60  Temp(Src) 98.6 F (37 C) (Oral)  Resp 12  SpO2 100% Physical Exam  Constitutional: He is oriented to person, place, and time. He appears well-developed and well-nourished.  Abdominal: Soft. Bowel sounds are normal. Hernia confirmed negative in the right inguinal area and confirmed negative in the left inguinal  area.  Genitourinary: Testes normal and penis normal. Cremasteric reflex is present. Right testis shows no tenderness. Left testis shows no tenderness. Circumcised. No penile tenderness. No discharge found.  Lymphadenopathy:       Right: No inguinal adenopathy present.       Left: No inguinal adenopathy present.  Neurological: He is alert and oriented to person, place, and time.  Nursing note and vitals reviewed.   ED Course  Procedures (including critical care time) Labs Review Labs Reviewed  CYTOLOGY, (ORAL, ANAL, URETHRAL) ANCILLARY ONLY    Imaging Review No results found.   MDM   1. Concern about STD in male without diagnosis        Linna HoffJames D Lonnell Chaput, MD 03/11/15 2126

## 2015-03-09 LAB — CYTOLOGY, (ORAL, ANAL, URETHRAL) ANCILLARY ONLY
Chlamydia: NEGATIVE
NEISSERIA GONORRHEA: NEGATIVE

## 2015-09-03 ENCOUNTER — Emergency Department (INDEPENDENT_AMBULATORY_CARE_PROVIDER_SITE_OTHER)
Admission: EM | Admit: 2015-09-03 | Discharge: 2015-09-03 | Disposition: A | Discharge status: Home / Self Care | Payer: Self-pay | Source: Home / Self Care | Attending: Family Medicine | Admitting: Family Medicine

## 2015-09-03 ENCOUNTER — Other Ambulatory Visit (HOSPITAL_COMMUNITY)
Admission: RE | Admit: 2015-09-03 | Discharge: 2015-09-03 | Disposition: A | Discharge status: Home / Self Care | Payer: Self-pay | Source: Ambulatory Visit | Attending: Family Medicine | Admitting: Family Medicine

## 2015-09-03 ENCOUNTER — Encounter (HOSPITAL_COMMUNITY): Payer: Self-pay | Admitting: *Deleted

## 2015-09-03 DIAGNOSIS — N342 Other urethritis: Secondary | ICD-10-CM

## 2015-09-03 DIAGNOSIS — Z113 Encounter for screening for infections with a predominantly sexual mode of transmission: Secondary | ICD-10-CM | POA: Insufficient documentation

## 2015-09-03 MED ORDER — CEFTRIAXONE SODIUM 250 MG IJ SOLR
250.0000 mg | Freq: Once | INTRAMUSCULAR | Status: AC
  Administered 2015-09-03: 250 mg via INTRAMUSCULAR

## 2015-09-03 MED ORDER — AZITHROMYCIN 250 MG PO TABS
ORAL_TABLET | ORAL | Status: AC
  Filled 2015-09-03: qty 4

## 2015-09-03 MED ORDER — CEFTRIAXONE SODIUM 250 MG IJ SOLR
INTRAMUSCULAR | Status: AC
  Filled 2015-09-03: qty 250

## 2015-09-03 MED ORDER — AZITHROMYCIN 250 MG PO TABS
1000.0000 mg | ORAL_TABLET | Freq: Once | ORAL | Status: AC
  Administered 2015-09-03: 1000 mg via ORAL

## 2015-09-03 NOTE — ED Provider Notes (Signed)
CSN: [id number]     Arrival date & time 09/03/15  1317 History   First MD Initiated Contact with Patient 09/03/15 1412     Chief Complaint  Patient presents with  . Testicle Pain   (Consider location/radiation/quality/duration/timing/severity/associated sxs/prior Treatment) Patient is a 19 y.o. male presenting with testicular pain. The history is provided by the patient.  Testicle Pain This is a recurrent problem. The current episode started yesterday. The problem has not changed since onset.Associated symptoms comments: teticle pain, urethral d/c, dysuria similar to prev episode of chlamydia.Marland Kitchen.    History reviewed. No pertinent past medical history. History reviewed. No pertinent past surgical history. History reviewed. No pertinent family history. Social History  Substance Use Topics  . Smoking status: Never Smoker   . Smokeless tobacco: None  . Alcohol Use: No    Review of Systems  Gastrointestinal: Negative.   Genitourinary: Positive for dysuria, discharge and testicular pain. Negative for penile swelling, scrotal swelling, difficulty urinating and genital sores.    Allergies  Review of patient's allergies indicates no known allergies.  Home Medications   Prior to Admission medications   Medication Sig Start Date End Date Taking? Authorizing Provider  dextromethorphan-guaiFENesin (MUCINEX DM) 30-600 MG per 12 hr tablet Take 1 tablet by mouth 2 (two) times daily. 11/05/13   Vanetta MuldersScott Zackowski, MD  DM-Doxylamine-Acetaminophen (VICKS NYQUIL COLD & FLU) 15-6.25-325 MG/15ML LIQD Take 15 mLs by mouth at bedtime as needed (cold symptoms).    Historical Provider, MD   Meds Ordered and Administered this Visit   Medications  cefTRIAXone (ROCEPHIN) injection 250 mg (not administered)  azithromycin (ZITHROMAX) tablet 1,000 mg (not administered)    BP 157/78 mmHg  Pulse 63  Temp(Src) 98 F (36.7 C) (Oral)  Resp 16  SpO2 100% No data found.   Physical Exam  Constitutional: He  is oriented to person, place, and time. He appears well-developed and well-nourished.  Abdominal: Soft.  Genitourinary: Right testis shows swelling and tenderness. Right testis shows no mass. Circumcised. Discharge found.  Lymphadenopathy:       Right: No inguinal adenopathy present.       Left: No inguinal adenopathy present.  Neurological: He is alert and oriented to person, place, and time.  Skin: Skin is warm and dry.  Nursing note and vitals reviewed.   ED Course  Procedures (including critical care time)  Labs Review Labs Reviewed  URINE CYTOLOGY ANCILLARY ONLY    Imaging Review No results found.   Visual Acuity Review  Right Eye Distance:   Left Eye Distance:   Bilateral Distance:    Right Eye Near:   Left Eye Near:    Bilateral Near:         MDM   1. Infective urethritis        Linna HoffJames D Freddye Cardamone, MD 09/03/15 [phone]

## 2015-09-03 NOTE — Discharge Instructions (Signed)
We will call with positive test results and treat as indicated  °

## 2015-09-03 NOTE — ED Notes (Signed)
Pt      Reports    Symptoms  Of  Testicular  Pain  As  Well  As  A  Penile  Discharge         Which  Started  yest     Pt  Dents  Any  Other  Symptoms      Pt  Ambulated to  Room  With a  steady  Fluid  Gait       Sitting  Upright on the  Exam table  Speaking in complete  sentances

## 2015-09-05 LAB — URINE CYTOLOGY ANCILLARY ONLY
Chlamydia: NEGATIVE
Neisseria Gonorrhea: NEGATIVE

## 2015-09-05 NOTE — ED Notes (Signed)
Final report of GC, chlamydia negative

## 2015-11-07 ENCOUNTER — Emergency Department (HOSPITAL_COMMUNITY)
Admission: EM | Admit: 2015-11-07 | Discharge: 2015-11-07 | Disposition: A | Discharge status: Home / Self Care | Payer: Worker's Compensation | Attending: Emergency Medicine | Admitting: Emergency Medicine

## 2015-11-07 ENCOUNTER — Encounter (HOSPITAL_COMMUNITY): Payer: Self-pay | Admitting: Emergency Medicine

## 2015-11-07 DIAGNOSIS — Z79899 Other long term (current) drug therapy: Secondary | ICD-10-CM | POA: Insufficient documentation

## 2015-11-07 DIAGNOSIS — Y9389 Activity, other specified: Secondary | ICD-10-CM | POA: Diagnosis not present

## 2015-11-07 DIAGNOSIS — Y990 Civilian activity done for income or pay: Secondary | ICD-10-CM | POA: Diagnosis not present

## 2015-11-07 DIAGNOSIS — Y9289 Other specified places as the place of occurrence of the external cause: Secondary | ICD-10-CM | POA: Diagnosis not present

## 2015-11-07 DIAGNOSIS — Z23 Encounter for immunization: Secondary | ICD-10-CM | POA: Insufficient documentation

## 2015-11-07 DIAGNOSIS — W260XXA Contact with knife, initial encounter: Secondary | ICD-10-CM | POA: Insufficient documentation

## 2015-11-07 DIAGNOSIS — S6992XA Unspecified injury of left wrist, hand and finger(s), initial encounter: Secondary | ICD-10-CM | POA: Diagnosis present

## 2015-11-07 DIAGNOSIS — S61211A Laceration without foreign body of left index finger without damage to nail, initial encounter: Secondary | ICD-10-CM | POA: Diagnosis not present

## 2015-11-07 DIAGNOSIS — IMO0002 Reserved for concepts with insufficient information to code with codable children: Secondary | ICD-10-CM

## 2015-11-07 MED ORDER — LIDOCAINE HCL (PF) 1 % IJ SOLN
2.0000 mL | Freq: Once | INTRAMUSCULAR | Status: AC
  Administered 2015-11-07: 2 mL
  Filled 2015-11-07: qty 5

## 2015-11-07 MED ORDER — TETANUS-DIPHTH-ACELL PERTUSSIS 5-2.5-18.5 LF-MCG/0.5 IM SUSP
0.5000 mL | Freq: Once | INTRAMUSCULAR | Status: AC
  Administered 2015-11-07: 0.5 mL via INTRAMUSCULAR
  Filled 2015-11-07: qty 0.5

## 2015-11-07 NOTE — Discharge Instructions (Signed)
A product was placed on your wound to form a temporary scab please leave the dressing in place for 48 hours than change as needed

## 2015-11-07 NOTE — ED Notes (Signed)
Patient presents for laceration to left index finger. Reports cutting it with a box cutter at work. Bleeding not controlled, no visual bone. Wound cleaned with sterile saline, wet to dry dressing applied to control bleeding. Patient denies decreased sensation to same. Rates pain 4/10.

## 2015-11-07 NOTE — ED Provider Notes (Signed)
CSN: [id number]     Arrival date & time 11/07/15  2023 History   By signing my name below, I, [name], attest that this documentation has been prepared under the direction and in the presence of Earley FavorGail Perri Lamagna, FNP.  Electronically Signed: Arlan OrganAshley Leger, ED Scribe. 11/07/2015. 9:26 PM.   Chief Complaint  Patient presents with  . Finger Injury   The history is provided by the patient. No language interpreter was used.    HPI Comments: [name] is a 20 y.o. male without any pertinent past medical history who presents to the Emergency Department here for a L index finger injury sustained approximately 1 hour prior to arrival. Pt states he was opening a box with a box cutter when he accidentally cut his finger. Bleeding controlled at this time. He now presents with an open wound to the finger. Pt c/o of constant, ongoing mild pain to the area. No cleansing attempted prior to arrival. Pt denies any recent fever, chills, nausea, vomiting. No weakness, loss of sensation, or numbness to finger. He is due for a Tetanus shot this evening.   PCP: No primary care provider on file.    History reviewed. No pertinent past medical history. History reviewed. No pertinent past surgical history. No family history on file. Social History  Substance Use Topics  . Smoking status: Never Smoker   . Smokeless tobacco: None  . Alcohol Use: No    Review of Systems  Constitutional: Negative for fever and chills.  Gastrointestinal: Negative for nausea and vomiting.  Musculoskeletal: Positive for arthralgias.  Skin: Positive for wound.  Neurological: Negative for weakness and numbness.  Psychiatric/Behavioral: Negative for confusion.      Allergies  Review of patient's allergies indicates no known allergies.  Home Medications   Prior to Admission medications   Medication Sig Start Date End Date Taking? Authorizing Provider  dextromethorphan-guaiFENesin (MUCINEX DM) 30-600 MG per 12 hr tablet Take  1 tablet by mouth 2 (two) times daily. 11/05/13   Vanetta MuldersScott Zackowski, MD  DM-Doxylamine-Acetaminophen (VICKS NYQUIL COLD & FLU) 15-6.25-325 MG/15ML LIQD Take 15 mLs by mouth at bedtime as needed (cold symptoms).    Historical Provider, MD   Triage Vitals: BP 141/96 mmHg  Pulse 77  Temp(Src) 97.9 F (36.6 C) (Oral)  Resp 20  Ht 6\' 1"  (1.854 m)  Wt 171 lb (77.565 kg)  BMI 22.57 kg/m2  SpO2 100%   Physical Exam  Constitutional: He is oriented to person, place, and time. He appears well-developed and well-nourished.  HENT:  Head: Normocephalic.  Eyes: EOM are normal.  Neck: Normal range of motion.  Cardiovascular: Normal rate, regular rhythm and normal heart sounds.   Pulmonary/Chest: Effort normal.  Abdominal: He exhibits no distension.  Musculoskeletal: Normal range of motion.  Neurological: He is alert and oriented to person, place, and time.  Skin:  1 cm x 1.5 cm avulsion to the tip of the L index finger  Psychiatric: He has a normal mood and affect.  Nursing note and vitals reviewed.   ED Course  .Marland Kitchen.Laceration Repair Date/Time: 11/07/2015 9:50 PM Performed by: Earley FavorSCHULZ, Deundra Furber Authorized by: Earley FavorSCHULZ, Namir Neto Consent: Verbal consent obtained. Written consent not obtained. Risks and benefits: risks, benefits and alternatives were discussed Consent given by: patient Patient understanding: patient states understanding of the procedure being performed Patient identity confirmed: verbally with patient Body area: upper extremity Location details: right index finger Laceration length: 1 cm Foreign bodies: no foreign bodies Tendon involvement: none Nerve involvement: none  Vascular damage: no Anesthesia: digital block Local anesthetic: lidocaine 1% without epinephrine Anesthetic total: 1 ml Preparation: Patient was prepped and draped in the usual sterile fashion. Irrigation solution: saline Amount of cleaning: standard Patient tolerance: Patient tolerated the procedure well with no  immediate complications Comments: Wound seal applied with pressure dressing   (including critical care time)  DIAGNOSTIC STUDIES: Oxygen Saturation is 100% on RA, Normal by my interpretation.    COORDINATION OF CARE: 9:18 PM- Will apply wound seal and pressure dressing to finger. Discussed treatment plan with pt at bedside and pt agreed to plan.     Labs Review Labs Reviewed - No data to display  Imaging Review No results found. I have personally reviewed and evaluated these images and lab results as part of my medical decision-making.   EKG Interpretation None     Patient instructed to keep pressure dressing on for 48 hours  MDM   Final diagnoses:  None    I personally performed the services described in this documentation, which was scribed in my presence. The recorded information has been reviewed and is accurate.  Earley Favor, NP 11/07/15 2154  Nelva Nay, MD 11/12/15 [phone]

## 2018-04-07 ENCOUNTER — Ambulatory Visit (HOSPITAL_COMMUNITY)
Admission: EM | Admit: 2018-04-07 | Discharge: 2018-04-07 | Disposition: A | Discharge status: Home / Self Care | Payer: Managed Care, Other (non HMO) | Attending: Family Medicine | Admitting: Family Medicine

## 2018-04-07 ENCOUNTER — Encounter (HOSPITAL_COMMUNITY): Payer: Self-pay | Admitting: Emergency Medicine

## 2018-04-07 ENCOUNTER — Other Ambulatory Visit: Payer: Self-pay

## 2018-04-07 ENCOUNTER — Ambulatory Visit: Payer: Self-pay | Admitting: Urgent Care

## 2018-04-07 DIAGNOSIS — N50819 Testicular pain, unspecified: Secondary | ICD-10-CM

## 2018-04-07 DIAGNOSIS — Z711 Person with feared health complaint in whom no diagnosis is made: Secondary | ICD-10-CM

## 2018-04-07 DIAGNOSIS — N451 Epididymitis: Secondary | ICD-10-CM | POA: Diagnosis not present

## 2018-04-07 DIAGNOSIS — Z202 Contact with and (suspected) exposure to infections with a predominantly sexual mode of transmission: Secondary | ICD-10-CM

## 2018-04-07 DIAGNOSIS — R369 Urethral discharge, unspecified: Secondary | ICD-10-CM

## 2018-04-07 DIAGNOSIS — M25511 Pain in right shoulder: Secondary | ICD-10-CM | POA: Insufficient documentation

## 2018-04-07 DIAGNOSIS — Z113 Encounter for screening for infections with a predominantly sexual mode of transmission: Secondary | ICD-10-CM | POA: Diagnosis not present

## 2018-04-07 LAB — POCT URINALYSIS DIP (DEVICE)
BILIRUBIN URINE: NEGATIVE
Glucose, UA: NEGATIVE mg/dL
Hgb urine dipstick: NEGATIVE
Ketones, ur: NEGATIVE mg/dL
LEUKOCYTES UA: NEGATIVE
NITRITE: NEGATIVE
PH: 6 (ref 5.0–8.0)
Protein, ur: NEGATIVE mg/dL
Specific Gravity, Urine: 1.025 (ref 1.005–1.030)
Urobilinogen, UA: 0.2 mg/dL (ref 0.0–1.0)

## 2018-04-07 MED ORDER — CEFTRIAXONE SODIUM 250 MG IJ SOLR
INTRAMUSCULAR | Status: AC
  Filled 2018-04-07: qty 250

## 2018-04-07 MED ORDER — CEFTRIAXONE SODIUM 250 MG IJ SOLR
250.0000 mg | Freq: Once | INTRAMUSCULAR | Status: AC
  Administered 2018-04-07: 250 mg via INTRAMUSCULAR

## 2018-04-07 MED ORDER — DIPHENHYDRAMINE HCL 25 MG PO CAPS
ORAL_CAPSULE | ORAL | Status: AC
  Filled 2018-04-07: qty 1

## 2018-04-07 MED ORDER — DOXYCYCLINE HYCLATE 100 MG PO CAPS: 100.0000 mg | ORAL_CAPSULE | Freq: Two times a day (BID) | ORAL | 0 refills | Status: AC

## 2018-04-07 MED ORDER — NAPROXEN 500 MG PO TABS: 500.0000 mg | ORAL_TABLET | Freq: Two times a day (BID) | ORAL | 0 refills | Status: DC

## 2018-04-07 NOTE — ED Provider Notes (Signed)
[facility]    CSN: [id number] Arrival date & time: 04/07/18  1429     History   Chief Complaint Chief Complaint  Patient presents with  . Shoulder Pain    right  . Exposure to STD    HPI Jack Stone is a 22 y.o. male.   Jack Stone presents with complaints of right shoulder pain with lifting weights which has been going for weeks now. States he still has been lifting without taking breaks, at times he lifts through the pain. No pain if not lifting. Max of 7/10 pain. No numbness or tingling to hand. Right handed. No previous shoulder injury. Has not taken any medications for symptoms.    Also concerned about stds as he had unprotected sex two days ago and now has left testicular pain and swelling as well as possibly penile discharge. Occasional pain with urination. States has had similar in the past and was chlamydia. No other sores or lesions.  No fevers or abdominal pain.    ROS per HPI.      History reviewed. No pertinent past medical history.  There are no active problems to display for this patient.   History reviewed. No pertinent surgical history.     Home Medications    Prior to Admission medications   Medication Sig Start Date End Date Taking? Authorizing Provider  doxycycline (VIBRAMYCIN) 100 MG capsule Take 1 capsule (100 mg total) by mouth 2 (two) times daily for 10 days. 04/07/18 04/17/18  Georgetta Haber, NP  naproxen (NAPROSYN) 500 MG tablet Take 1 tablet (500 mg total) by mouth 2 (two) times daily. 04/07/18   Georgetta Haber, NP    Family History History reviewed. No pertinent family history.  Social History Social History   Tobacco Use  . Smoking status: Never Smoker  . Smokeless tobacco: Never Used  Substance Use Topics  . Alcohol use: No  . Drug use: Never     Allergies   Patient has no known allergies.   Review of Systems Review of Systems   Physical Exam Triage Vital Signs ED Triage Vitals [04/07/18 1530]  Enc Vitals  Group     BP 116/72     Pulse Rate 66     Resp      Temp 97.9 F (36.6 C)     Temp Source Oral     SpO2 100 %     Weight      Height      Head Circumference      Peak Flow      Pain Score 2     Pain Loc      Pain Edu?      Excl. in GC?    No data found.  Updated Vital Signs BP 116/72 (BP Location: Left Arm)   Pulse 66   Temp 97.9 F (36.6 C) (Oral)   SpO2 100%   Visual Acuity Right Eye Distance:   Left Eye Distance:   Bilateral Distance:    Right Eye Near:   Left Eye Near:    Bilateral Near:     Physical Exam  Constitutional: He is oriented to person, place, and time. He appears well-developed and well-nourished.  Cardiovascular: Normal rate and regular rhythm.  Pulmonary/Chest: Effort normal and breath sounds normal.  Genitourinary: Left testis shows swelling and tenderness.  Genitourinary Comments: Mild swelling and redness with tenderness to left testicle; no lesions or redness to penis; without palpable adenopathy  Musculoskeletal:  Right shoulder: He exhibits pain. He exhibits normal range of motion, no tenderness, no bony tenderness, no laceration, no spasm, normal pulse and normal strength.       Arms: Indicates pain to right anterior shoulder; no limit to ROM and non tender on palpation; strength equal in all fields on ROM to bilateral upper extremities; without pain with ac compression  Neurological: He is alert and oriented to person, place, and time.  Skin: Skin is warm and dry.     UC Treatments / Results  Labs (all labs ordered are listed, but only abnormal results are displayed) Labs Reviewed  POCT URINALYSIS DIP (DEVICE)  URINE CYTOLOGY ANCILLARY ONLY    EKG None  Radiology No results found.  Procedures Procedures (including critical care time)  Medications Ordered in UC Medications  cefTRIAXone (ROCEPHIN) injection 250 mg (has no administration in time range)    Initial Impression / Assessment and Plan / UC Course  I have  reviewed the triage vital signs and the nursing notes.  Pertinent labs & imaging results that were available during my care of the patient were reviewed by me and considered in my medical decision making (see chart for details).     Naproxen for shoulder pain, encouraged rest, appears likely impingement. Follow up with sports med as needed.  Treated empirically with rocephin as well as doxy for epididymitis. Urine cytology pending. Encouraged safe sex practices. Patient verbalized understanding and agreeable to plan.    Final Clinical Impressions(s) / UC Diagnoses   Final diagnoses:  Acute pain of right shoulder  Concern about STD in male without diagnosis  Epididymitis     Discharge Instructions     Rest and ice of the shoulder, see exercises as provided. Follow up with sports medicine if symptoms persist or worsen. We are treating you today for std's as well as epididymitis please complete course of  antibiotics. Please withhold from intercourse for the next week. Please use condoms to prevent STD's.     ED Prescriptions    Medication Sig Dispense Auth. Provider   doxycycline (VIBRAMYCIN) 100 MG capsule Take 1 capsule (100 mg total) by mouth 2 (two) times daily for 10 days. 20 capsule Linus MakoBurky, Lis Savitt B, NP   naproxen (NAPROSYN) 500 MG tablet Take 1 tablet (500 mg total) by mouth 2 (two) times daily. 30 tablet Georgetta HaberBurky, Mischell Branford B, NP     Controlled Substance Prescriptions [place] Controlled Substance Registry consulted? Not Applicable   Georgetta HaberBurky, Calvyn Kurtzman B, NP 04/07/18 1624

## 2018-04-07 NOTE — Discharge Instructions (Signed)
Rest and ice of the shoulder, see exercises as provided. Follow up with sports medicine if symptoms persist or worsen. We are treating you today for std's as well as epididymitis please complete course of  antibiotics. Please withhold from intercourse for the next week. Please use condoms to prevent STD's.

## 2018-04-07 NOTE — ED Triage Notes (Signed)
Pt reports pain to his right shoulder after lifting weights on Friday.  He states the pain is a 2/10 but when he is lifting weights it will become a 6-8/10.  Pt is also concerned because he had unprotected intercourse on Saturday with some slight groin pain.

## 2018-04-08 LAB — URINE CYTOLOGY ANCILLARY ONLY
Chlamydia: NEGATIVE
NEISSERIA GONORRHEA: NEGATIVE
TRICH (WINDOWPATH): NEGATIVE

## 2020-01-08 ENCOUNTER — Ambulatory Visit: Payer: HRSA Program | Attending: Internal Medicine

## 2020-01-08 DIAGNOSIS — Z20822 Contact with and (suspected) exposure to covid-19: Secondary | ICD-10-CM | POA: Diagnosis not present

## 2020-01-09 LAB — NOVEL CORONAVIRUS, NAA: SARS-CoV-2, NAA: NOT DETECTED

## 2021-07-11 ENCOUNTER — Other Ambulatory Visit: Payer: Self-pay

## 2021-07-11 ENCOUNTER — Encounter (HOSPITAL_COMMUNITY): Payer: Self-pay | Admitting: Emergency Medicine

## 2021-07-11 ENCOUNTER — Ambulatory Visit (HOSPITAL_COMMUNITY)
Admission: EM | Admit: 2021-07-11 | Discharge: 2021-07-11 | Disposition: A | Discharge status: Home / Self Care | Payer: Managed Care, Other (non HMO) | Attending: Emergency Medicine | Admitting: Emergency Medicine

## 2021-07-11 DIAGNOSIS — R059 Cough, unspecified: Secondary | ICD-10-CM | POA: Diagnosis not present

## 2021-07-11 MED ORDER — PREDNISONE 20 MG PO TABS: 40.0000 mg | ORAL_TABLET | Freq: Every day | ORAL | 0 refills | Status: AC

## 2021-07-11 MED ORDER — ALBUTEROL SULFATE HFA 108 (90 BASE) MCG/ACT IN AERS: 2.0000 | INHALATION_SPRAY | RESPIRATORY_TRACT | 0 refills | Status: AC | PRN

## 2021-07-11 NOTE — ED Provider Notes (Signed)
[facility]    CSN: [id number] Arrival date & time: 07/11/21  1841      History   Chief Complaint Chief Complaint  Patient presents with   Cough    HPI Jack Stone is a 25 y.o. male.   Patient presents with productive cough for 4 days and intermittent shortness of breath with activity, resolved with rest. Denies wheezing, chest pain or tightness, fever, chills, URI symptoms. No known sick contacts. History of asthma, seasonal allergies, no current medication use.   History reviewed. No pertinent past medical history.  There are no problems to display for this patient.   History reviewed. No pertinent surgical history.     Home Medications    Prior to Admission medications   Medication Sig Start Date End Date Taking? Authorizing Provider  albuterol (VENTOLIN HFA) 108 (90 Base) MCG/ACT inhaler Inhale 2 puffs into the lungs every 4 (four) hours as needed for wheezing or shortness of breath. 07/11/21  Yes Arta Stump R, NP  predniSONE (DELTASONE) 20 MG tablet Take 2 tablets (40 mg total) by mouth daily. 07/11/21  Yes Valinda Hoar, NP    Family History No family history on file.  Social History Social History   Tobacco Use   Smoking status: Never   Smokeless tobacco: Never  Vaping Use   Vaping Use: Never used  Substance Use Topics   Alcohol use: No   Drug use: Never     Allergies   Patient has no known allergies.   Review of Systems Review of Systems  Constitutional: Negative.   HENT: Negative.    Respiratory:  Positive for cough and shortness of breath. Negative for apnea, choking, chest tightness, wheezing and stridor.   Skin: Negative.     Physical Exam Triage Vital Signs ED Triage Vitals  Enc Vitals Group     BP 07/11/21 1932 (!) 151/88     Pulse Rate 07/11/21 1932 60     Resp 07/11/21 1932 15     Temp 07/11/21 1932 98.3 F (36.8 C)     Temp Source 07/11/21 1932 Oral     SpO2 07/11/21 1932 100 %     Weight --      Height  --      Head Circumference --      Peak Flow --      Pain Score 07/11/21 1931 0     Pain Loc --      Pain Edu? --      Excl. in GC? --    No data found.  Updated Vital Signs BP (!) 151/88 (BP Location: Left Arm)   Pulse 60   Temp 98.3 F (36.8 C) (Oral)   Resp 15   SpO2 100%   Visual Acuity Right Eye Distance:   Left Eye Distance:   Bilateral Distance:    Right Eye Near:   Left Eye Near:    Bilateral Near:     Physical Exam Constitutional:      Appearance: Normal appearance. He is normal weight.  HENT:     Head: Normocephalic.     Right Ear: Tympanic membrane, ear canal and external ear normal.     Left Ear: Tympanic membrane, ear canal and external ear normal.     Nose: Nose normal.     Mouth/Throat:     Mouth: Mucous membranes are moist.     Pharynx: Oropharynx is clear.  Eyes:     Extraocular Movements: Extraocular movements intact.  Cardiovascular:     Rate and Rhythm: Normal rate and regular rhythm.     Pulses: Normal pulses.     Heart sounds: Normal heart sounds.  Pulmonary:     Effort: Pulmonary effort is normal.     Breath sounds: Examination of the right-upper field reveals wheezing. Examination of the left-upper field reveals wheezing. Wheezing present.  Musculoskeletal:     Cervical back: Normal range of motion and neck supple.  Skin:    General: Skin is warm and dry.  Neurological:     Mental Status: He is alert and oriented to person, place, and time. Mental status is at baseline.  Psychiatric:        Mood and Affect: Mood normal.        Behavior: Behavior normal.     UC Treatments / Results  Labs (all labs ordered are listed, but only abnormal results are displayed) Labs Reviewed - No data to display  EKG   Radiology No results found.  Procedures Procedures (including critical care time)  Medications Ordered in UC Medications - No data to display  Initial Impression / Assessment and Plan / UC Course  I have reviewed the triage  vital signs and the nursing notes.  Pertinent labs & imaging results that were available during my care of the patient were reviewed by me and considered in my medical decision making (see chart for details).  Cough  Low suspicion for viral illness, pneumonia, bronchitis, pneumothorax, discussed with patient, Vitals signs stable, non ill appearing, will treat conservatively, will defer chest x-ray, patient in agreement    Prednisone 40 mg daily for 5 days Albuterol inhaler 90 mcg 2 puffs every 4 hours prn Return precautions for worsening or persistent symptoms  Final Clinical Impressions(s) / UC Diagnoses   Final diagnoses:  Cough     Discharge Instructions      Take prednisone every morning food with foods  Can use albuterol inhaler two puffs every 4 hours as needed for shortness of breath   Can use over the counter Mucinex as needed for congestion ] May return for evaluation to urgent care as needed for persistent or worsening symptoms       ED Prescriptions     Medication Sig Dispense Auth. Provider   predniSONE (DELTASONE) 20 MG tablet Take 2 tablets (40 mg total) by mouth daily. 10 tablet Ahmon Tosi R, NP   albuterol (VENTOLIN HFA) 108 (90 Base) MCG/ACT inhaler Inhale 2 puffs into the lungs every 4 (four) hours as needed for wheezing or shortness of breath. 18 g Valinda Hoar, NP      PDMP not reviewed this encounter.   Valinda Hoar, NP 07/11/21 1958

## 2021-07-11 NOTE — Discharge Instructions (Addendum)
Take prednisone every morning food with foods  Can use albuterol inhaler two puffs every 4 hours as needed for shortness of breath   Can use over the counter Mucinex as needed for congestion ] May return for evaluation to urgent care as needed for persistent or worsening symptoms

## 2021-07-11 NOTE — ED Triage Notes (Signed)
Pt reports had cough with greenish phlegm x 4 days.

## 2023-04-14 ENCOUNTER — Emergency Department (HOSPITAL_COMMUNITY)
Admission: EM | Admit: 2023-04-14 | Discharge: 2023-04-14 | Disposition: A | Discharge status: Home / Self Care | Payer: No Typology Code available for payment source | Attending: Emergency Medicine | Admitting: Emergency Medicine

## 2023-04-14 ENCOUNTER — Other Ambulatory Visit: Payer: Self-pay

## 2023-04-14 DIAGNOSIS — S39012A Strain of muscle, fascia and tendon of lower back, initial encounter: Secondary | ICD-10-CM | POA: Insufficient documentation

## 2023-04-14 DIAGNOSIS — S3992XA Unspecified injury of lower back, initial encounter: Secondary | ICD-10-CM | POA: Diagnosis present

## 2023-04-14 MED ORDER — IBUPROFEN 600 MG PO TABS: 600.0000 mg | ORAL_TABLET | Freq: Four times a day (QID) | ORAL | 0 refills | Status: AC | PRN
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
## 2023-04-14 NOTE — ED Notes (Signed)
Discharge papers reviewed with pt, pt ambulatory from ED 
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
## 2023-04-17 ENCOUNTER — Ambulatory Visit (HOSPITAL_COMMUNITY)
Admission: EM | Admit: 2023-04-17 | Discharge: 2023-04-17 | Disposition: A | Discharge status: Home / Self Care | Payer: Self-pay | Attending: Family Medicine | Admitting: Family Medicine

## 2023-04-17 ENCOUNTER — Encounter (HOSPITAL_COMMUNITY): Payer: Self-pay

## 2023-04-17 DIAGNOSIS — S39012D Strain of muscle, fascia and tendon of lower back, subsequent encounter: Secondary | ICD-10-CM

## 2023-04-17 MED ORDER — METHOCARBAMOL 500 MG PO TABS: 500.0000 mg | ORAL_TABLET | Freq: Two times a day (BID) | ORAL | 0 refills | Status: AC
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
## 2024-11-09 ENCOUNTER — Ambulatory Visit (HOSPITAL_COMMUNITY)
Admission: EM | Admit: 2024-11-09 | Discharge: 2024-11-09 | Disposition: A | Discharge status: Home / Self Care | Attending: Family | Admitting: Family

## 2024-11-09 DIAGNOSIS — Z789 Other specified health status: Secondary | ICD-10-CM

## 2024-11-09 DIAGNOSIS — Z008 Encounter for other general examination: Secondary | ICD-10-CM | POA: Insufficient documentation

## 2024-11-09 NOTE — Discharge Instructions (Signed)
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
